# Patient Record
Sex: Male | Born: 1980 | Race: Black or African American | Hispanic: No | Marital: Single
Health system: Southern US, Community
[De-identification: ages and names within clinical notes are randomized; demographics above are authoritative.]

---

## 2008-05-08 ENCOUNTER — Emergency Department (HOSPITAL_COMMUNITY): Admission: EM | Admit: 2008-05-08 | Discharge: 2008-05-08 | Payer: Self-pay | Admitting: Emergency Medicine

## 2010-06-30 ENCOUNTER — Emergency Department (HOSPITAL_COMMUNITY): Admission: EM | Admit: 2010-06-30 | Discharge: 2010-06-30 | Payer: Self-pay | Admitting: Emergency Medicine

## 2010-08-11 ENCOUNTER — Emergency Department (HOSPITAL_COMMUNITY): Admission: EM | Admit: 2010-08-11 | Discharge: 2010-08-12 | Payer: Self-pay | Admitting: Emergency Medicine

## 2011-06-28 LAB — CULTURE, ROUTINE-ABSCESS

## 2014-01-19 ENCOUNTER — Emergency Department (HOSPITAL_COMMUNITY): Payer: Self-pay

## 2014-01-19 ENCOUNTER — Emergency Department (HOSPITAL_COMMUNITY)
Admission: EM | Admit: 2014-01-19 | Discharge: 2014-01-20 | Disposition: A | Discharge status: Home / Self Care | Payer: Self-pay | Attending: Emergency Medicine | Admitting: Emergency Medicine

## 2014-01-19 DIAGNOSIS — S71109A Unspecified open wound, unspecified thigh, initial encounter: Principal | ICD-10-CM | POA: Insufficient documentation

## 2014-01-19 DIAGNOSIS — S71139A Puncture wound without foreign body, unspecified thigh, initial encounter: Secondary | ICD-10-CM

## 2014-01-19 DIAGNOSIS — S71009A Unspecified open wound, unspecified hip, initial encounter: Secondary | ICD-10-CM | POA: Insufficient documentation

## 2014-01-19 DIAGNOSIS — W3400XA Accidental discharge from unspecified firearms or gun, initial encounter: Secondary | ICD-10-CM | POA: Insufficient documentation

## 2014-01-19 DIAGNOSIS — Z23 Encounter for immunization: Secondary | ICD-10-CM | POA: Insufficient documentation

## 2014-01-19 DIAGNOSIS — Y9301 Activity, walking, marching and hiking: Secondary | ICD-10-CM | POA: Insufficient documentation

## 2014-01-19 DIAGNOSIS — Y9289 Other specified places as the place of occurrence of the external cause: Secondary | ICD-10-CM | POA: Insufficient documentation

## 2014-01-19 LAB — CBC WITH DIFFERENTIAL/PLATELET
Basophils Absolute: 0 10*3/uL (ref 0.0–0.1)
Basophils Relative: 0 % (ref 0–1)
EOS ABS: 0.2 10*3/uL (ref 0.0–0.7)
EOS PCT: 2 % (ref 0–5)
HEMATOCRIT: 45.5 % (ref 39.0–52.0)
HEMOGLOBIN: 15.8 g/dL (ref 13.0–17.0)
LYMPHS ABS: 1.8 10*3/uL (ref 0.7–4.0)
Lymphocytes Relative: 18 % (ref 12–46)
MCH: 31.2 pg (ref 26.0–34.0)
MCHC: 34.7 g/dL (ref 30.0–36.0)
MCV: 89.7 fL (ref 78.0–100.0)
MONOS PCT: 6 % (ref 3–12)
Monocytes Absolute: 0.6 10*3/uL (ref 0.1–1.0)
Neutro Abs: 7.7 10*3/uL (ref 1.7–7.7)
Neutrophils Relative %: 74 % (ref 43–77)
Platelets: 188 10*3/uL (ref 150–400)
RBC: 5.07 MIL/uL (ref 4.22–5.81)
RDW: 13.2 % (ref 11.5–15.5)
WBC: 10.3 10*3/uL (ref 4.0–10.5)

## 2014-01-19 MED ORDER — TETANUS-DIPHTH-ACELL PERTUSSIS 5-2.5-18.5 LF-MCG/0.5 IM SUSP
INTRAMUSCULAR | Status: AC
  Filled 2014-01-19: qty 0.5

## 2014-01-19 MED ORDER — SODIUM CHLORIDE 0.9 % IV SOLN
Freq: Once | INTRAVENOUS | Status: AC
Start: 2014-01-19 — End: 2014-01-20
  Administered 2014-01-19: 125 mL/h via INTRAVENOUS

## 2014-01-19 MED ORDER — TETANUS-DIPHTH-ACELL PERTUSSIS 5-2.5-18.5 LF-MCG/0.5 IM SUSP
0.5000 mL | Freq: Once | INTRAMUSCULAR | Status: AC
  Administered 2014-01-19: 0.5 mL via INTRAMUSCULAR

## 2014-01-19 MED ORDER — FENTANYL CITRATE 0.05 MG/ML IJ SOLN
50.0000 ug | INTRAMUSCULAR | Status: DC | PRN
  Administered 2014-01-19 – 2014-01-20 (×3): 50 ug via INTRAVENOUS
  Filled 2014-01-19 (×2): qty 2

## 2014-01-19 MED ORDER — ONDANSETRON HCL 4 MG/2ML IJ SOLN
INTRAMUSCULAR | Status: AC
  Filled 2014-01-19: qty 2

## 2014-01-19 MED ORDER — FENTANYL CITRATE 0.05 MG/ML IJ SOLN
INTRAMUSCULAR | Status: AC
  Administered 2014-01-19: 50 ug via INTRAVENOUS
  Filled 2014-01-19: qty 2

## 2014-01-19 MED ORDER — ONDANSETRON HCL 4 MG/2ML IJ SOLN
4.0000 mg | Freq: Once | INTRAMUSCULAR | Status: AC
  Administered 2014-01-19: 4 mg via INTRAVENOUS

## 2014-01-19 NOTE — ED Notes (Signed)
Patient returned from testing.
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
## 2014-01-19 NOTE — ED Notes (Signed)
Dressed wounds and gave warm blankets.
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
## 2014-01-19 NOTE — ED Notes (Signed)
Pt getting portable x-rays.
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
## 2014-01-20 ENCOUNTER — Encounter (HOSPITAL_COMMUNITY): Payer: Self-pay | Admitting: Emergency Medicine

## 2014-01-20 LAB — BASIC METABOLIC PANEL
BUN: 8 mg/dL (ref 6–23)
CO2: 23 mEq/L (ref 19–32)
Calcium: 9.5 mg/dL (ref 8.4–10.5)
Chloride: 104 mEq/L (ref 96–112)
Creatinine, Ser: 1.1 mg/dL (ref 0.50–1.35)
GFR calc Af Amer: >90 mL/min (ref >90)
GFR, EST NON AFRICAN AMERICAN: 87 mL/min — AB (ref >90)
GLUCOSE: 99 mg/dL (ref 70–99)
POTASSIUM: 3.8 meq/L (ref 3.7–5.3)
Sodium: 143 mEq/L (ref 137–147)

## 2014-01-20 MED ORDER — MORPHINE SULFATE 4 MG/ML IJ SOLN
4.0000 mg | Freq: Once | INTRAMUSCULAR | Status: AC
  Administered 2014-01-20: 4 mg via INTRAVENOUS
  Filled 2014-01-20: qty 1

## 2014-01-20 MED ORDER — HYDROCODONE-ACETAMINOPHEN 5-325 MG PO TABS: 1.0000 | ORAL_TABLET | Freq: Four times a day (QID) | ORAL | Status: AC | PRN

## 2014-01-20 MED ORDER — CEPHALEXIN 500 MG PO CAPS: 500.0000 mg | ORAL_CAPSULE | Freq: Four times a day (QID) | ORAL | Status: AC

## 2014-01-20 NOTE — ED Notes (Signed)
Provided wound care, and applied dressings.
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
## 2014-01-20 NOTE — ED Notes (Signed)
[employer] at the bedside 

## 2014-01-20 NOTE — ED Provider Notes (Signed)
I saw and evaluated the patient, reviewed the resident's note and I agree with the findings and plan.  Wound care provided. ABIs performed and reassuring. Clinically no arterial injury. Plan outpatient followup with referrals provided. ABx provided.   Sunnie NielsenBrian Brieonna Crutcher, MD 01/20/14 [phone]
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
## 2023-04-29 DIAGNOSIS — R079 Chest pain, unspecified: Secondary | ICD-10-CM | POA: Diagnosis not present

## 2023-04-29 DIAGNOSIS — R000 Tachycardia, unspecified: Secondary | ICD-10-CM | POA: Diagnosis not present

## 2023-04-29 DIAGNOSIS — I10 Essential (primary) hypertension: Secondary | ICD-10-CM | POA: Diagnosis not present
